# Patient Record
Sex: Male | Born: 1980
Health system: Southern US, Community
[De-identification: ages and names within clinical notes are randomized; demographics above are authoritative.]

---

## 2017-06-20 DIAGNOSIS — S134XXA Sprain of ligaments of cervical spine, initial encounter: Secondary | ICD-10-CM | POA: Diagnosis not present

## 2017-06-20 DIAGNOSIS — M546 Pain in thoracic spine: Secondary | ICD-10-CM | POA: Diagnosis not present

## 2017-06-20 DIAGNOSIS — S161XXA Strain of muscle, fascia and tendon at neck level, initial encounter: Secondary | ICD-10-CM | POA: Diagnosis not present

## 2017-08-04 DIAGNOSIS — R350 Frequency of micturition: Secondary | ICD-10-CM | POA: Diagnosis not present

## 2017-08-04 DIAGNOSIS — N390 Urinary tract infection, site not specified: Secondary | ICD-10-CM | POA: Diagnosis not present

## 2017-08-04 DIAGNOSIS — R319 Hematuria, unspecified: Secondary | ICD-10-CM | POA: Diagnosis not present

## 2017-09-28 ENCOUNTER — Encounter (HOSPITAL_COMMUNITY): Payer: Self-pay | Admitting: Emergency Medicine

## 2017-09-28 ENCOUNTER — Emergency Department (HOSPITAL_COMMUNITY)
Admission: EM | Admit: 2017-09-28 | Discharge: 2017-09-28 | Disposition: A | Discharge status: Home / Self Care | Payer: 59 | Attending: Emergency Medicine | Admitting: Emergency Medicine

## 2017-09-28 ENCOUNTER — Emergency Department (HOSPITAL_COMMUNITY): Payer: 59

## 2017-09-28 DIAGNOSIS — Y93H9 Activity, other involving exterior property and land maintenance, building and construction: Secondary | ICD-10-CM | POA: Diagnosis not present

## 2017-09-28 DIAGNOSIS — Y999 Unspecified external cause status: Secondary | ICD-10-CM | POA: Insufficient documentation

## 2017-09-28 DIAGNOSIS — Y92009 Unspecified place in unspecified non-institutional (private) residence as the place of occurrence of the external cause: Secondary | ICD-10-CM | POA: Insufficient documentation

## 2017-09-28 DIAGNOSIS — S51811A Laceration without foreign body of right forearm, initial encounter: Secondary | ICD-10-CM | POA: Diagnosis not present

## 2017-09-28 DIAGNOSIS — S59911A Unspecified injury of right forearm, initial encounter: Secondary | ICD-10-CM | POA: Diagnosis not present

## 2017-09-28 DIAGNOSIS — S61411A Laceration without foreign body of right hand, initial encounter: Secondary | ICD-10-CM | POA: Diagnosis not present

## 2017-09-28 DIAGNOSIS — W25XXXA Contact with sharp glass, initial encounter: Secondary | ICD-10-CM | POA: Diagnosis not present

## 2017-09-28 MED ORDER — CEPHALEXIN 500 MG PO CAPS: ORAL_CAPSULE | ORAL | 0 refills | Status: AC

## 2017-09-28 NOTE — Discharge Instructions (Signed)
I am discharging you with Keflex.  You do not have to take this antibiotic unless you develop signs of infection which including worsening pain out of proportion to initial injury, heat, redness, swelling, streaking up the arm, fevers or chills.  Follow good wound care instructions including gently washing the region with antibacterial soap and water twice daily and changing your dressing twice daily.

## 2017-09-28 NOTE — ED Triage Notes (Signed)
Pt with laceration to R hand and forearm 09/27/17.  Pt's hand went through glass window.

## 2017-09-28 NOTE — ED Provider Notes (Signed)
[facility]-EMERGENCY DEPT Provider Note   CSN: [id number] Arrival date & time: 09/28/17  1333     History   Chief Complaint Chief Complaint  Patient presents with  . Extremity Laceration    HPI Ian Bell is a 37 y.o. male who presents emergency department for chief complaint of right hand laceration.  Patient states that he out of his house and was trying to break away the pain to seal around his window when he accidentally broke the window and put his right hand through the glass.  He suffered a laceration on the radial side of his forearm and has some small cuts to the right hand.  He is up-to-date on his tetanus vaccination.  This occurred yesterday around 2 PM in the afternoon.  Patient understands that he is out of the window for repair of the laceration.  He has some minor tenderness and some serous drainage from the wound but denies any severe pain, pain with movement of the hands.  He denies fevers or chills.  HPI  History reviewed. No pertinent past medical history.  There are no active problems to display for this patient.   History reviewed. No pertinent surgical history.     Home Medications    Prior to Admission medications   Medication Sig Start Date End Date Taking? Authorizing Provider  cephALEXin (KEFLEX) 500 MG capsule 2 caps po bid x 7 days 09/28/17   Arthor CaptainHarris, Jaclene Bartelt, PA-C    Family History History reviewed. No pertinent family history.  Social History Social History   Tobacco Use  . Smoking status: Never Smoker  Substance Use Topics  . Alcohol use: No    Frequency: Never  . Drug use: No     Allergies   Patient has no known allergies.   Review of Systems Review of Systems Ten systems reviewed and are negative for acute change, except as noted in the HPI.    Physical Exam Updated Vital Signs BP 118/65 (BP Location: Left Arm)   Pulse 65   Temp 98 F (36.7 C) (Oral)   Resp 16   SpO2 100%   Physical Exam    Constitutional: He appears well-developed and well-nourished. No distress.  HENT:  Head: Normocephalic and atraumatic.  Eyes: Conjunctivae are normal. No scleral icterus.  Neck: Normal range of motion. Neck supple.  Cardiovascular: Normal rate, regular rhythm, normal heart sounds and intact distal pulses.  Pulmonary/Chest: Effort normal and breath sounds normal. No respiratory distress.  Abdominal: Soft. There is no tenderness.  Musculoskeletal: He exhibits no edema.  Patient with 3 cm triangular tissue avulsion on the radial surface of the right forearm, mild surrounding erythema without significant tenderness or streaking.  Multiple small scratches on the hands without other evidence of penetrating injury, full range of motion of the forearm and hand.  Neurological: He is alert.  Skin: Skin is warm and dry. He is not diaphoretic.  Psychiatric: His behavior is normal.  Nursing note and vitals reviewed.    ED Treatments / Results  Labs (all labs ordered are listed, but only abnormal results are displayed) Labs Reviewed - No data to display  EKG  EKG Interpretation None       Radiology Dg Forearm Right  Result Date: 09/28/2017 CLINICAL DATA:  Laceration EXAM: RIGHT FOREARM - 2 VIEW COMPARISON:  None. FINDINGS: Frontal and lateral views obtained. No radiopaque foreign body evident. No soft tissue air. No fracture or dislocation. Joint spaces appear normal. IMPRESSION: No radiopaque  foreign body evident. No bony abnormality. No soft tissue air. Electronically Signed   By: Bretta Bang III M.D.   On: 09/28/2017 15:09   Dg Hand Complete Right  Result Date: 09/28/2017 CLINICAL DATA:  Right hand laceration. EXAM: RIGHT HAND - COMPLETE 3+ VIEW COMPARISON:  No recent prior. FINDINGS: No acute bony or joint abnormality identified. No evidence of fracture dislocation. No radiopaque foreign body noted. IMPRESSION: No acute bony abnormality.  No radiopaque foreign body. Electronically  Signed   By: Maisie Fus  Register   On: 09/28/2017 15:14    Procedures Procedures (including critical care time)  Medications Ordered in ED Medications - No data to display   Initial Impression / Assessment and Plan / ED Course  I have reviewed the triage vital signs and the nursing notes.  Pertinent labs & imaging results that were available during my care of the patient were reviewed by me and considered in my medical decision making (see chart for details).     Patient with laceration.  He is out of the window for repair.  I reviewed the x-ray and there is no radiopaque foreign body on examination suggestive of retained glass.  I did discuss the possibility of potential retained glass despite no radiographic evidence and patient is aware of the potential risks of retained foreign bodies.  We will not close the wound today but the patient has had his wound irrigated and dressed.  He is up-to-date on his tetanus vaccination.  Given the patient Keflex to hold should he have signs of infection he is otherwise medically screened and appears appropriate for discharge with return precautions discussed.  Final Clinical Impressions(s) / ED Diagnoses   Final diagnoses:  Laceration of right forearm, initial encounter    ED Discharge Orders        Ordered    cephALEXin (KEFLEX) 500 MG capsule     09/28/17 1537       Arthor Captain, PA-C 09/28/17 1538    Benjiman Core, MD 09/28/17 [phone]

## 2018-02-12 DIAGNOSIS — S134XXA Sprain of ligaments of cervical spine, initial encounter: Secondary | ICD-10-CM | POA: Diagnosis not present

## 2018-02-12 DIAGNOSIS — S161XXA Strain of muscle, fascia and tendon at neck level, initial encounter: Secondary | ICD-10-CM | POA: Diagnosis not present

## 2018-02-12 DIAGNOSIS — M546 Pain in thoracic spine: Secondary | ICD-10-CM | POA: Diagnosis not present

## 2018-10-07 DIAGNOSIS — S134XXA Sprain of ligaments of cervical spine, initial encounter: Secondary | ICD-10-CM | POA: Diagnosis not present

## 2018-10-07 DIAGNOSIS — M546 Pain in thoracic spine: Secondary | ICD-10-CM | POA: Diagnosis not present

## 2018-10-07 DIAGNOSIS — S161XXA Strain of muscle, fascia and tendon at neck level, initial encounter: Secondary | ICD-10-CM | POA: Diagnosis not present

## 2018-10-09 DIAGNOSIS — S134XXA Sprain of ligaments of cervical spine, initial encounter: Secondary | ICD-10-CM | POA: Diagnosis not present

## 2018-10-09 DIAGNOSIS — S161XXA Strain of muscle, fascia and tendon at neck level, initial encounter: Secondary | ICD-10-CM | POA: Diagnosis not present

## 2018-10-09 DIAGNOSIS — M546 Pain in thoracic spine: Secondary | ICD-10-CM | POA: Diagnosis not present

## 2018-10-14 DIAGNOSIS — S134XXA Sprain of ligaments of cervical spine, initial encounter: Secondary | ICD-10-CM | POA: Diagnosis not present

## 2018-10-14 DIAGNOSIS — M546 Pain in thoracic spine: Secondary | ICD-10-CM | POA: Diagnosis not present

## 2018-10-14 DIAGNOSIS — S161XXA Strain of muscle, fascia and tendon at neck level, initial encounter: Secondary | ICD-10-CM | POA: Diagnosis not present

## 2018-10-21 DIAGNOSIS — M546 Pain in thoracic spine: Secondary | ICD-10-CM | POA: Diagnosis not present

## 2018-10-21 DIAGNOSIS — S161XXA Strain of muscle, fascia and tendon at neck level, initial encounter: Secondary | ICD-10-CM | POA: Diagnosis not present

## 2018-10-21 DIAGNOSIS — S134XXA Sprain of ligaments of cervical spine, initial encounter: Secondary | ICD-10-CM | POA: Diagnosis not present

## 2018-10-28 DIAGNOSIS — S134XXA Sprain of ligaments of cervical spine, initial encounter: Secondary | ICD-10-CM | POA: Diagnosis not present

## 2018-10-28 DIAGNOSIS — S161XXA Strain of muscle, fascia and tendon at neck level, initial encounter: Secondary | ICD-10-CM | POA: Diagnosis not present

## 2018-10-28 DIAGNOSIS — M546 Pain in thoracic spine: Secondary | ICD-10-CM | POA: Diagnosis not present

## 2018-11-04 DIAGNOSIS — S161XXA Strain of muscle, fascia and tendon at neck level, initial encounter: Secondary | ICD-10-CM | POA: Diagnosis not present

## 2018-11-04 DIAGNOSIS — S134XXA Sprain of ligaments of cervical spine, initial encounter: Secondary | ICD-10-CM | POA: Diagnosis not present

## 2018-11-04 DIAGNOSIS — M546 Pain in thoracic spine: Secondary | ICD-10-CM | POA: Diagnosis not present

## 2018-11-18 DIAGNOSIS — S161XXA Strain of muscle, fascia and tendon at neck level, initial encounter: Secondary | ICD-10-CM | POA: Diagnosis not present

## 2018-11-18 DIAGNOSIS — M546 Pain in thoracic spine: Secondary | ICD-10-CM | POA: Diagnosis not present

## 2018-11-18 DIAGNOSIS — S134XXA Sprain of ligaments of cervical spine, initial encounter: Secondary | ICD-10-CM | POA: Diagnosis not present

## 2018-11-25 DIAGNOSIS — S161XXA Strain of muscle, fascia and tendon at neck level, initial encounter: Secondary | ICD-10-CM | POA: Diagnosis not present

## 2018-11-25 DIAGNOSIS — M546 Pain in thoracic spine: Secondary | ICD-10-CM | POA: Diagnosis not present

## 2018-11-25 DIAGNOSIS — S134XXA Sprain of ligaments of cervical spine, initial encounter: Secondary | ICD-10-CM | POA: Diagnosis not present

## 2018-12-23 DIAGNOSIS — Z6823 Body mass index (BMI) 23.0-23.9, adult: Secondary | ICD-10-CM | POA: Diagnosis not present

## 2018-12-23 DIAGNOSIS — N451 Epididymitis: Secondary | ICD-10-CM | POA: Diagnosis not present

## 2020-01-11 IMAGING — CR DG HAND COMPLETE 3+V*R*
4 series · 4 of 4 positions shown · non-contrast
Comparison: No recent prior.

CLINICAL DATA: Right hand laceration.

EXAM:
RIGHT HAND - COMPLETE 3+ VIEW

[x hand pa right]
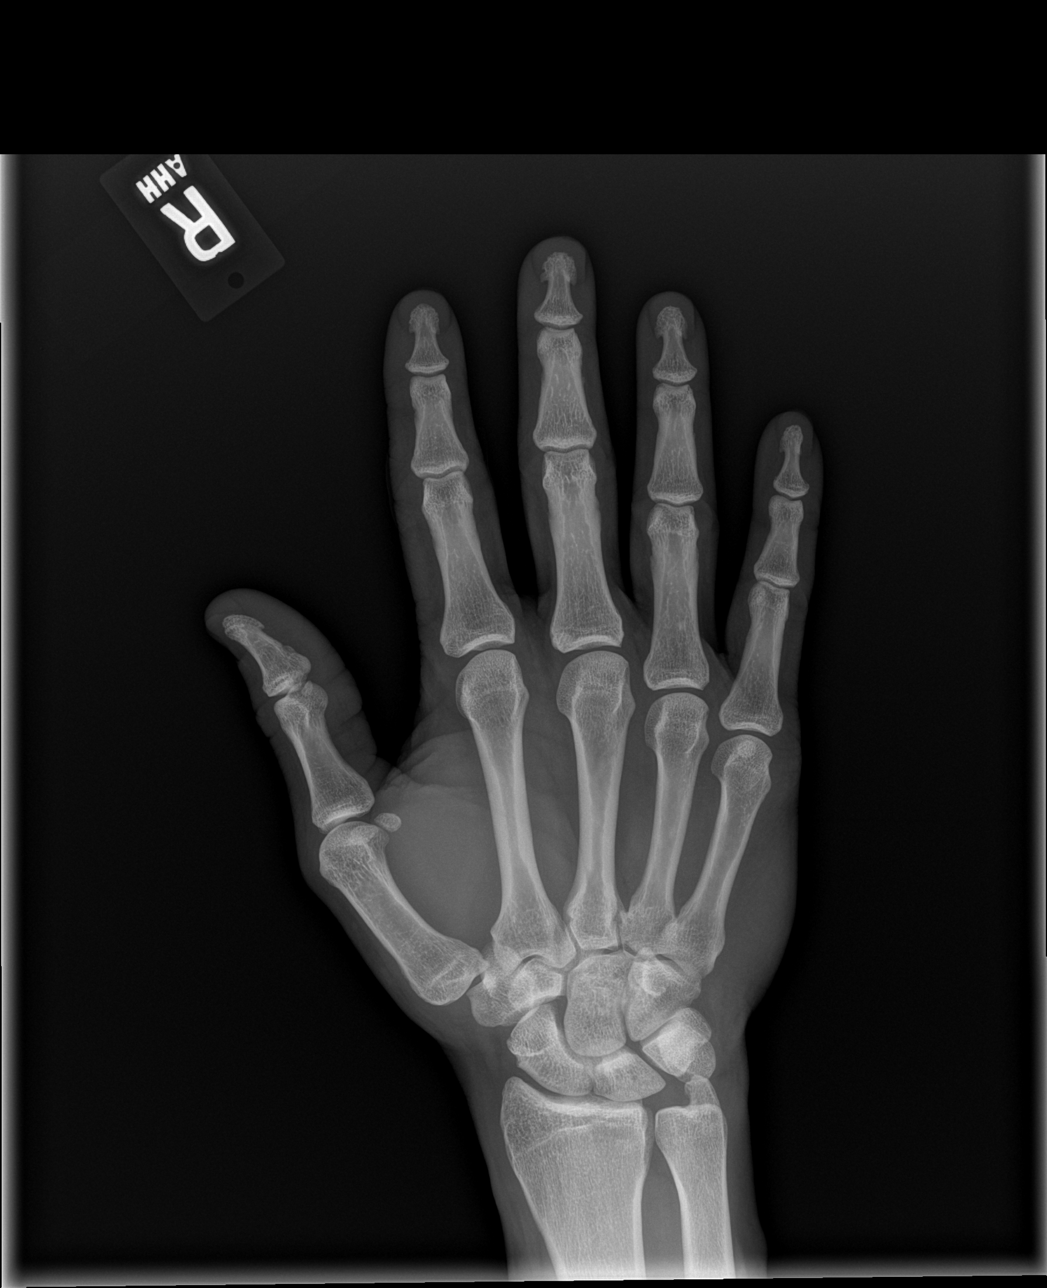

[x hand obl right]
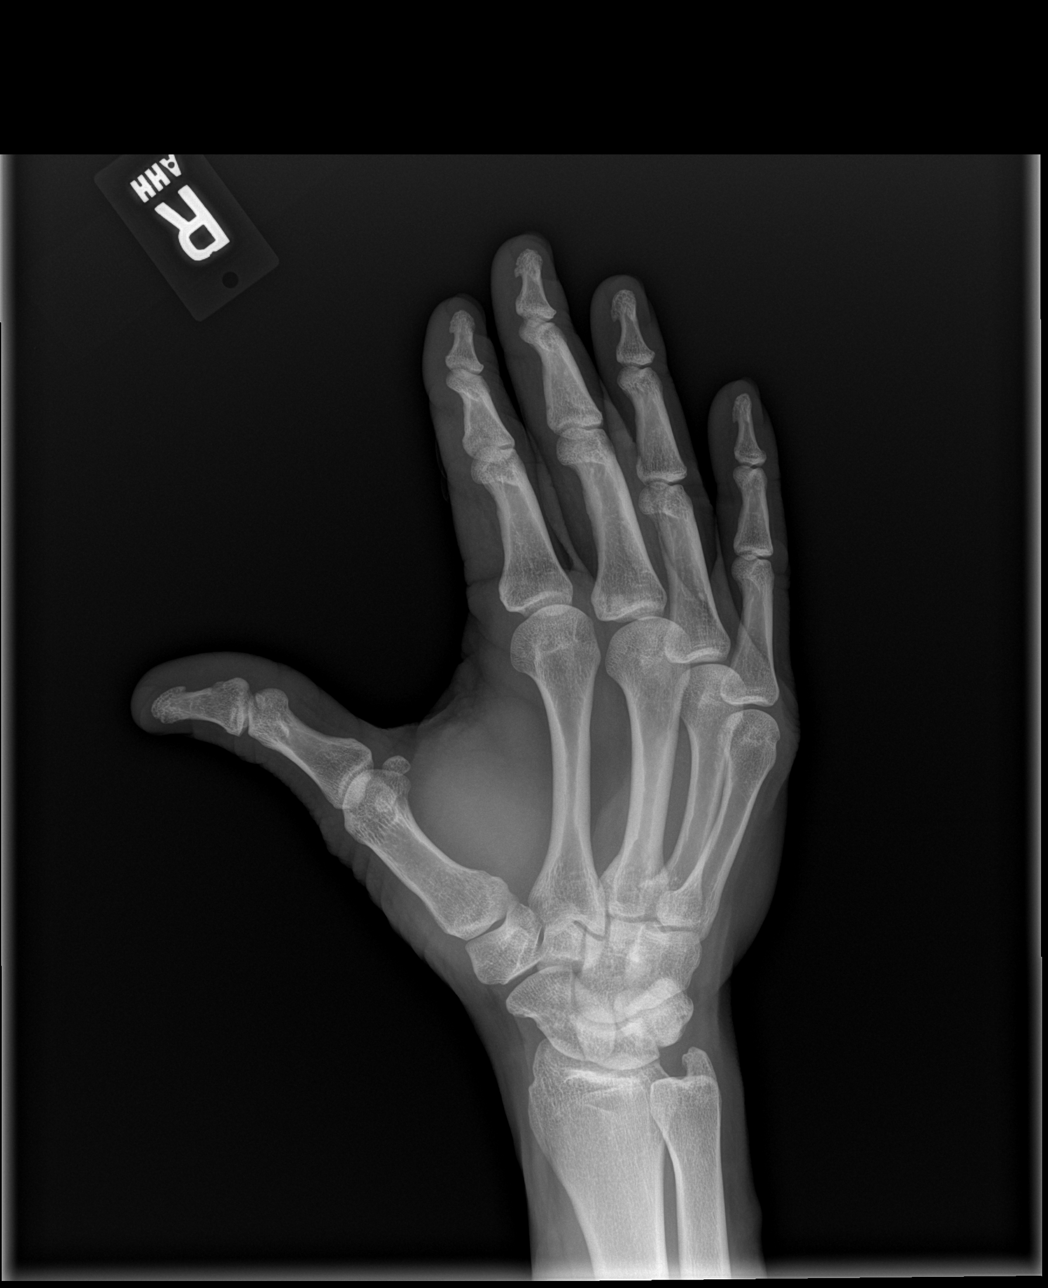

[x hand lat right (1 of 2)]
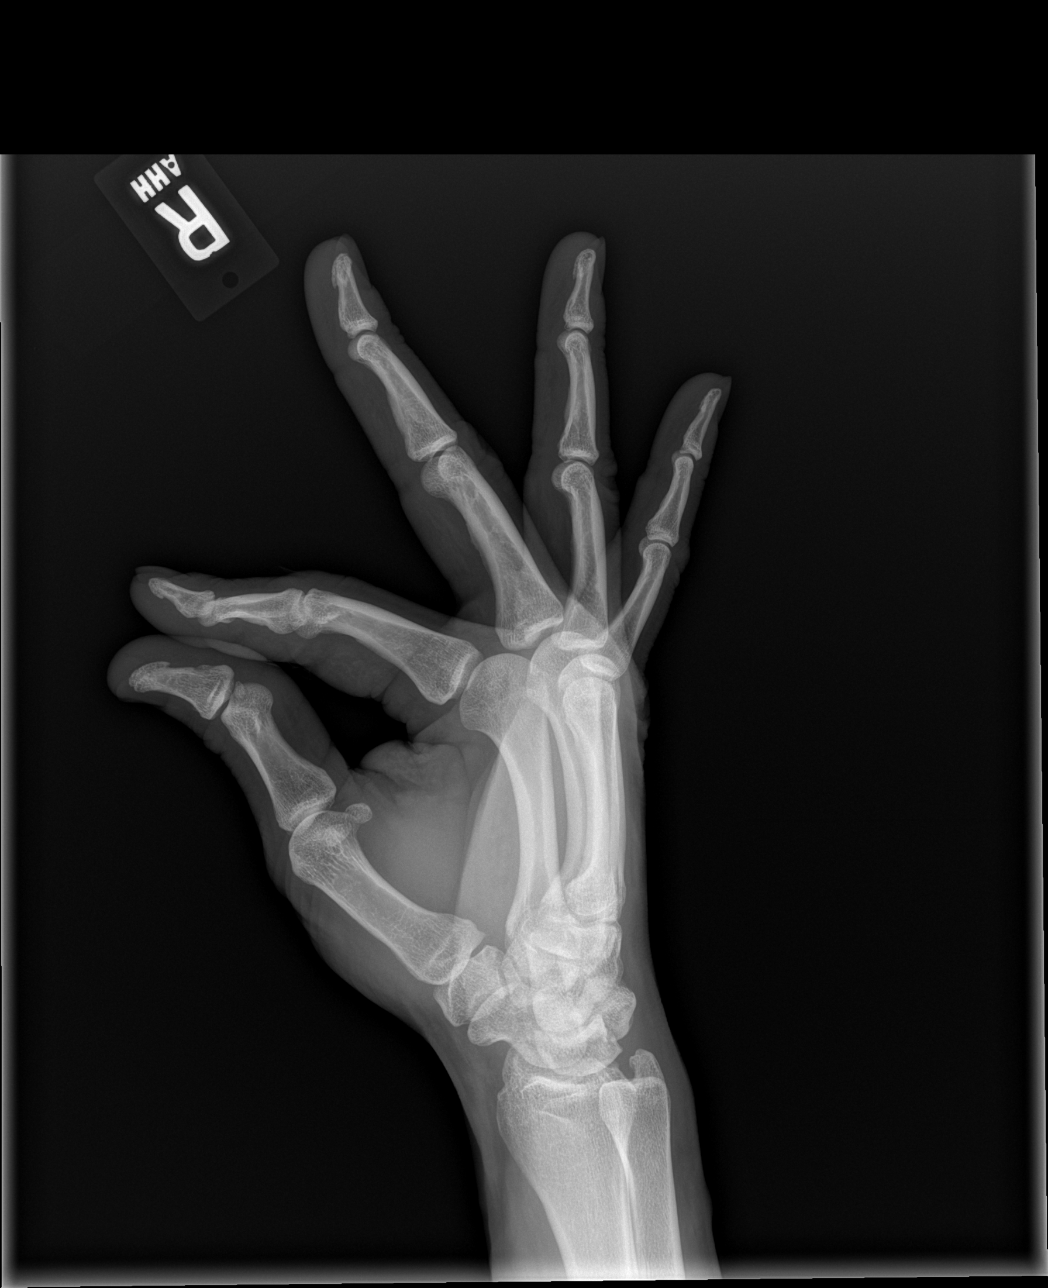

[x hand lat right (2 of 2)]
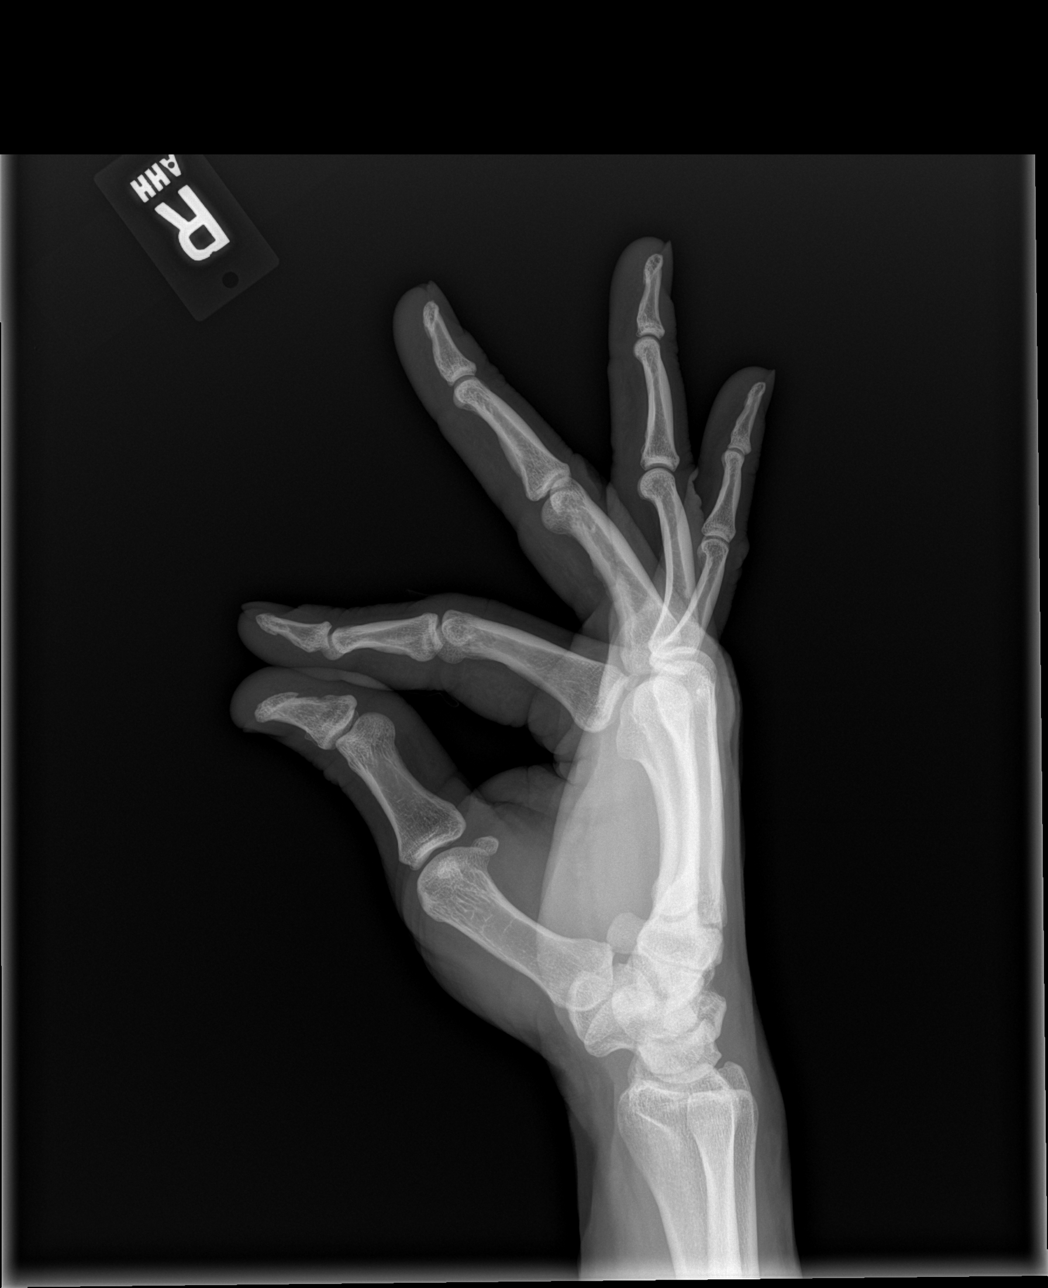

[4 of 4 positions shown; findings below may reference images not displayed]

FINDINGS: No acute bony or joint abnormality identified. No evidence of
fracture dislocation. No radiopaque foreign body noted.
IMPRESSION: No acute bony abnormality.  No radiopaque foreign body.
